# Patient Record
Sex: Female | Born: 2000 | Race: Black or African American | Hispanic: No | Marital: Single | Smoking: Never smoker
Health system: Southern US, Community
[De-identification: ages and names within clinical notes are randomized; demographics above are authoritative.]

## PROBLEM LIST (undated history)

## (undated) DIAGNOSIS — T7840XA Allergy, unspecified, initial encounter: Secondary | ICD-10-CM

## (undated) DIAGNOSIS — F32A Depression, unspecified: Secondary | ICD-10-CM

## (undated) DIAGNOSIS — F419 Anxiety disorder, unspecified: Secondary | ICD-10-CM

## (undated) HISTORY — DX: Allergy, unspecified, initial encounter: T78.40XA

## (undated) HISTORY — DX: Anxiety disorder, unspecified: F41.9

## (undated) HISTORY — PX: NO PAST SURGERIES: SHX2092

## (undated) HISTORY — DX: Depression, unspecified: F32.A

---
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
## 2022-05-14 ENCOUNTER — Ambulatory Visit
Admission: EM | Admit: 2022-05-14 | Discharge: 2022-05-14 | Disposition: A | Discharge status: Home / Self Care | Payer: Medicaid Other | Attending: Emergency Medicine | Admitting: Emergency Medicine

## 2022-05-14 ENCOUNTER — Encounter: Payer: Self-pay | Admitting: Emergency Medicine

## 2022-05-14 DIAGNOSIS — L739 Follicular disorder, unspecified: Secondary | ICD-10-CM
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
## 2022-05-14 MED ORDER — HYDROXYZINE HCL 25 MG PO TABS: 25.0000 mg | ORAL_TABLET | Freq: Four times a day (QID) | ORAL | 0 refills | Status: DC

## 2022-05-14 MED ORDER — PREDNISONE 20 MG PO TABS: 40.0000 mg | ORAL_TABLET | Freq: Every day | ORAL | 0 refills | Status: DC

## 2022-05-14 MED ORDER — CEPHALEXIN 500 MG PO CAPS: 500.0000 mg | ORAL_CAPSULE | Freq: Two times a day (BID) | ORAL | 0 refills | Status: AC

## 2022-05-14 NOTE — ED Provider Notes (Signed)
[facility]    CSN: [id number] Arrival date & time: 05/14/22  4008      History   Chief Complaint Chief Complaint  Patient presents with   Rash    HPI Dawn Mosley is a 21 y.o. female.   Patient presents with generalized scalp pruritus and right-sided scalp pain beginning 3 days ago.  Pruritus is described as severe .  Currently has an braids to the scalp and endorses that she began using a new oral within the last week, has used twice.  Symptoms have not occurred before.  Attempted use of antibiotic ointment which has been ineffective.  Irritation for 3 days, used new oil twice in the last week, itchys, pain to right sides, antibitoic ointment, not helpful,   History reviewed. No pertinent past medical history.  There are no problems to display for this patient.   Past Surgical History:  Procedure Laterality Date   NO PAST SURGERIES      OB History   No obstetric history on file.      Home Medications    Prior to Admission medications   Medication Sig Start Date End Date Taking? Authorizing Provider  naproxen (NAPROSYN) 375 MG tablet Take 1 tablet twice daily as needed for pain. 03/21/21   Molpus, Jonny Ruiz, MD    Family History Family History  Problem Relation Age of Onset   Cancer Mother    Healthy Father     Social History Social History   Tobacco Use   Smoking status: Never   Smokeless tobacco: Never  Vaping Use   Vaping Use: Every day  Substance Use Topics   Alcohol use: Never   Drug use: Never     Allergies   Patient has no known allergies.   Review of Systems Review of Systems  Constitutional: Negative.   Respiratory: Negative.    Cardiovascular: Negative.   Skin:  Positive for rash. Negative for color change, pallor and wound.     Physical Exam Triage Vital Signs ED Triage Vitals  Enc Vitals Group     BP 05/14/22 0955 119/87     Pulse Rate 05/14/22 0955 93     Resp 05/14/22 0955 14     Temp 05/14/22 0955 98.8 F  (37.1 C)     Temp Source 05/14/22 0955 Oral     SpO2 05/14/22 0955 100 %     Weight 05/14/22 0954 108 lb (49 kg)     Height 05/14/22 0954 5\' 4"  (1.626 m)     Head Circumference --      Peak Flow --      Pain Score 05/14/22 0953 8     Pain Loc --      Pain Edu? --      Excl. in GC? --    No data found.  Updated Vital Signs BP 119/87 (BP Location: Left Arm)   Pulse 93   Temp 98.8 F (37.1 C) (Oral)   Resp 14   Ht 5\' 4"  (1.626 m)   Wt 108 lb (49 kg)   LMP 04/21/2022 (Approximate)   SpO2 100%   BMI 18.54 kg/m   Visual Acuity Right Eye Distance:   Left Eye Distance:   Bilateral Distance:    Right Eye Near:   Left Eye Near:    Bilateral Near:     Physical Exam Constitutional:      Appearance: Normal appearance.  HENT:     Head: Normocephalic.  Eyes:     Extraocular  Movements: Extraocular movements intact.  Skin:    Comments: Erythematous raised follicles within the right parietal region, scalp is tender to palpation, no lesions or drainage noted, this is noted throughout the scalp  Neurological:     Mental Status: She is alert and oriented to person, place, and time. Mental status is at baseline.  Psychiatric:        Mood and Affect: Mood normal.        Behavior: Behavior normal.      UC Treatments / Results  Labs (all labs ordered are listed, but only abnormal results are displayed) Labs Reviewed - No data to display  EKG   Radiology No results found.  Procedures Procedures (including critical care time)  Medications Ordered in UC Medications - No data to display  Initial Impression / Assessment and Plan / UC Course  I have reviewed the triage vital signs and the nursing notes.  Pertinent labs & imaging results that were available during my care of the patient were reviewed by me and considered in my medical decision making (see chart for details).  Folliculitis  Presentation is consistent with follicular scalp irritation, discussed with  patient, possibly related to her tension, dandruff or use of new or new paranoia, recommended discontinuation of new product, advised to take out breaks to release tension maintain attention refills, until all symptoms have resolved, provided coverage for bacteria with Keflex and prescribed prednisone and hydroxyzine for management of inflammation and itching, patient may follow-up with urgent care as needed if symptoms persist or worsen Final Clinical Impressions(s) / UC Diagnoses   Final diagnoses:  None   Discharge Instructions   None    ED Prescriptions   None    PDMP not reviewed this encounter.   Valinda Hoar, NP 05/14/22 1026

## 2022-05-14 NOTE — ED Triage Notes (Signed)
Patient reports itchy scalp for the past 2-3 days.  Patient states that her scalp is now sore and painful especially on the right side.

## 2022-05-14 NOTE — Discharge Instructions (Signed)
Today you are being treated for folliculitis which is a inflammatory process of the hair follicle  Begin Keflex every morning and every evening to cover for bacteria that may be aiding to your symptoms  Begin prednisone every morning with food for 5 days, this will help to reduce inflammation and irritation which ideally will reduce your pain  You may take Tylenol 500 to 1000 mg every 6 hours and/or ibuprofen 600 to 800 mg every 6-8 hours for management of your discomfort  You may use hydroxyzine every 6 hours for management of itching, be mindful this medication may make you drowsy  I would recommending removing your braids to release tension from your scalp and to continue to wear a tension free hairstyle until all symptoms have resolved  Please stop use of new pair or you will until all symptoms have resolved as we are unsure at this time if it is a contributing factor  You may purchase over-the-counter Nizoral (ketoconazole) shampoo to help reduce dandruff which may cause additional irritation  May follow-up with urgent care as needed if symptoms persist or worse

## 2022-05-20 ENCOUNTER — Telehealth: Payer: Self-pay

## 2022-05-20 NOTE — Telephone Encounter (Signed)
Patient called to connect with a Primary Care Provider. Patient has Managed Medicaid. LVM for patient to call [phone] to discuss making an appoint with a Primary Care Provider. If patient returns call, please reach out to Sarah or Henritta Mutz, RN.   

## 2022-06-01 ENCOUNTER — Telehealth: Payer: Self-pay

## 2022-06-01 NOTE — Telephone Encounter (Signed)
Patient called to connect with a Primary Care Provider. Patient has Managed Medicaid. LVM for patient to call [phone] to discuss making an appoint with a Primary Care Provider. If patient returns call, please reach out to Sarah or Sonji Starkes, RN.   

## 2022-06-02 ENCOUNTER — Telehealth: Payer: Self-pay

## 2022-06-02 NOTE — Telephone Encounter (Signed)
Patient called to connect with a Primary Care Provider. Patient has Managed Medicaid. LVM for patient to call [phone] to discuss making an appoint with a Primary Care Provider. If patient returns call, please reach out to Sakari Alkhatib or Leslie, RN.   

## 2022-06-03 ENCOUNTER — Telehealth: Payer: Self-pay

## 2022-06-03 NOTE — Telephone Encounter (Signed)
Pt. Has Managed Medicaid. Appointment made with new PCP at [facility].

## 2022-09-24 ENCOUNTER — Ambulatory Visit (INDEPENDENT_AMBULATORY_CARE_PROVIDER_SITE_OTHER): Payer: BLUE CROSS/BLUE SHIELD | Admitting: Physician Assistant

## 2022-09-24 ENCOUNTER — Encounter: Payer: Self-pay | Admitting: Physician Assistant

## 2022-09-24 VITALS — BP 107/76 | HR 96

## 2022-09-24 DIAGNOSIS — Z833 Family history of diabetes mellitus: Secondary | ICD-10-CM

## 2022-09-24 DIAGNOSIS — R42 Dizziness and giddiness: Secondary | ICD-10-CM

## 2022-09-24 DIAGNOSIS — Z23 Encounter for immunization: Secondary | ICD-10-CM

## 2022-09-24 DIAGNOSIS — Z114 Encounter for screening for human immunodeficiency virus [HIV]: Secondary | ICD-10-CM | POA: Diagnosis not present

## 2022-09-24 DIAGNOSIS — Z1159 Encounter for screening for other viral diseases: Secondary | ICD-10-CM

## 2022-09-24 NOTE — Progress Notes (Signed)
I,Sha'taria Tyson,acting as a Education administrator for [employer], PA-C.,have documented all relevant documentation on the behalf of Mikey Kirschner, PA-C,as directed by  Mikey Kirschner, PA-C while in the presence of Mikey Kirschner, PA-C.  New patient visit   Patient: Dawn Mosley   DOB: [birth year]   22 y.o. Female  MRN: [id number] Visit Date: 09/24/2022  Today's healthcare provider: Mikey Kirschner, PA-C   Cc. Dizziness, establish care  Subjective    Dawn Mosley is a 22 y.o. female who presents today as a new patient to establish care.   Anemia Patient presents for evaluation of anemia. Believes it has been present for several  years . Associated signs & symptoms: dizziness/lightheadedness, family history of anemia (sister). Denies palpitations, syncope.  Dizziness when standing up too fast, sometimes sees starts. LMP 09/24/22, reports heavy, changes tampons 3-4 hours for the first 1-2 days.       09/24/2022    9:26 AM  GAD 7 : Generalized Anxiety Score  Nervous, Anxious, on Edge 1  Control/stop worrying 1  Worry too much - different things 2  Trouble relaxing 1  Restless 3  Easily annoyed or irritable 2  Afraid - awful might happen 0  Total GAD 7 Score 10  Anxiety Difficulty Very difficult         09/24/2022    9:26 AM  PHQ9 SCORE ONLY  PHQ-9 Total Score 6     Past Medical History:  Diagnosis Date   Allergy    Anxiety    Depression    Past Surgical History:  Procedure Laterality Date   NO PAST SURGERIES     Family Status  Relation Name Status   Mother  Alive   Father  Alive   Sister  Alive   Brother  Alive   MGM  Deceased   Family History  Problem Relation Age of Onset   Cancer Mother        removed 2-3 years ago (lump behind ear)   Diabetes Father 61 - 56       2-3 years ago   Anemia Sister    Lung cancer Maternal Grandmother 76 - 76   Social History   Socioeconomic History   Marital status: Single    Spouse name: Not on file   Number of  children: Not on file   Years of education: Not on file   Highest education level: Not on file  Occupational History   Not on file  Tobacco Use   Smoking status: Never   Smokeless tobacco: Never  Vaping Use   Vaping Use: Every day   Start date: 08/30/2017  Substance and Sexual Activity   Alcohol use: Never   Drug use: Never   Sexual activity: Not Currently    Birth control/protection: None  Other Topics Concern   Not on file  Social History Narrative   Not on file   Social Determinants of Health   Financial Resource Strain: Not on file  Food Insecurity: Not on file  Transportation Needs: Not on file  Physical Activity: Not on file  Stress: Not on file  Social Connections: Not on file   Outpatient Medications Prior to Visit  Medication Sig   [DISCONTINUED] hydrOXYzine (ATARAX) 25 MG tablet Take 1 tablet (25 mg total) by mouth every 6 (six) hours. (Patient not taking: Reported on 09/24/2022)   [DISCONTINUED] naproxen (NAPROSYN) 375 MG tablet Take 1 tablet twice daily as needed for pain. (Patient not taking: Reported on 09/24/2022)   [  DISCONTINUED] predniSONE (DELTASONE) 20 MG tablet Take 2 tablets (40 mg total) by mouth daily.   No facility-administered medications prior to visit.   No Known Allergies  Immunization History  Administered Date(s) Administered   COVID-19, mRNA, vaccine(Comirnaty)12 years and older 09/24/2022   DTaP 04/25/2003, 07/03/2003, 09/05/2003, 05/28/2004, 05/03/2007   HIB (PRP-OMP) 04/25/2003, 07/03/2003, 09/05/2003, 05/28/2004   HPV 9-valent 06/02/2017, 06/04/2020   Hepatitis B, PED/ADOLESCENT May 02, 2003, 03/26/2003, 01/28/2004   Influenza,inj,Quad PF,6+ Mos 09/24/2022   Influenza-Unspecified 09/05/2003, 08/20/2004, 06/26/2008, 05/29/2009, 05/29/2010, 05/20/2014, 06/02/2017   MMR 02/27/2004, 05/03/2007   MenQuadfi_Meningococcal Groups ACYW Conjugate 06/04/2020   Meningococcal B, Unspecified 05/20/2014   Meningococcal Mcv4o 06/04/2020    PFIZER(Purple Top)SARS-COV-2 Vaccination 04/19/2020   Pneumococcal Conjugate,unspecified 09/05/2003, 05/28/2004   Pneumococcal-Unspecified 04/25/2003, 07/03/2003   Polio, Unspecified 05/03/2003, 07/03/2003, 01/28/2004, 05/03/2007   Tdap 05/20/2014   Varicella 02/27/2004, 05/03/2007    Health Maintenance  Topic Date Due   Hepatitis C Screening  Never done   DTaP/Tdap/Td (7 - Td or Tdap) 05/20/2024   INFLUENZA VACCINE  Completed   HPV VACCINES  Completed   COVID-19 Vaccine  Completed   HIV Screening  Completed    Patient Care Team: Mikey Kirschner, PA-C as PCP - General (Physician Assistant)  Review of Systems  Constitutional:  Positive for fatigue.  HENT:  Positive for dental problem.   Allergic/Immunologic: Positive for environmental allergies.  Neurological:  Positive for dizziness, weakness and light-headedness.     Objective    Blood pressure 107/76, pulse 96, last menstrual period 09/24/2022, SpO2 100 %.   Physical Exam Constitutional:      General: She is awake.     Appearance: She is well-developed.  HENT:     Head: Normocephalic.  Eyes:     Conjunctiva/sclera: Conjunctivae normal.  Cardiovascular:     Rate and Rhythm: Normal rate and regular rhythm.     Heart sounds: Normal heart sounds.  Pulmonary:     Effort: Pulmonary effort is normal.     Breath sounds: Normal breath sounds.  Skin:    General: Skin is warm.  Neurological:     Mental Status: She is alert and oriented to person, place, and time.  Psychiatric:        Attention and Perception: Attention normal.        Mood and Affect: Mood normal.        Speech: Speech normal.        Behavior: Behavior is cooperative.    Depression Screen    09/24/2022    9:26 AM  PHQ 2/9 Scores  PHQ - 2 Score 2  PHQ- 9 Score 6   No results found for any visits on 09/24/22.  Assessment & Plan     Dizziness Vitals WNR Will evaluate for anemia -- cbc, cmp, folate, b12, iron .  2. Fhx DM Will check  A1c  Problem List Items Addressed This Visit   None Visit Diagnoses     Dizziness    -  Primary   Relevant Orders   CBC w/Diff/Platelet   Comprehensive Metabolic Panel (CMET)   Iron, TIBC and Ferritin Panel   B12 and Folate Panel   Encounter for screening for HIV       Relevant Orders   HIV antibody (with reflex)   Encounter for hepatitis C screening test for low risk patient       Relevant Orders   Hepatitis C antibody   Family history of diabetes mellitus  Relevant Orders   HgB A1c   Need for immunization against influenza       Relevant Orders   Flu Vaccine QUAD 65mo+IM (Fluarix, Fluzone & Alfiuria Quad PF) (Completed)   Encounter for immunization       Relevant Orders   Pfizer Fall 2023 Covid-19 Vaccine 22yrs and older (Completed)       Return if symptoms worsen or fail to improve; pending labs.     I, Alfredia Ferguson, PA-C have reviewed all documentation for this visit. The documentation on  09/24/22 for the exam, diagnosis, procedures, and orders are all accurate and complete.  Alfredia Ferguson, PA-C [facility] [address] [place], [place], [address] Office: [phone] Fax: [phone]    [facility]

## 2022-09-25 LAB — COMPREHENSIVE METABOLIC PANEL
ALT: 6 IU/L (ref 0–32)
AST: 12 IU/L (ref 0–40)
Albumin/Globulin Ratio: 1.4 (ref 1.2–2.2)
Albumin: 4.5 g/dL (ref 4.0–5.0)
Alkaline Phosphatase: 72 IU/L (ref 42–106)
BUN/Creatinine Ratio: 11 (ref 9–23)
BUN: 9 mg/dL (ref 6–20)
Bilirubin Total: 0.5 mg/dL (ref 0.0–1.2)
CO2: 23 mmol/L (ref 20–29)
Calcium: 10 mg/dL (ref 8.7–10.2)
Chloride: 101 mmol/L (ref 96–106)
Creatinine, Ser: 0.8 mg/dL (ref 0.57–1.00)
Globulin, Total: 3.3 g/dL (ref 1.5–4.5)
Glucose: 83 mg/dL (ref 70–99)
Potassium: 4.1 mmol/L (ref 3.5–5.2)
Sodium: 138 mmol/L (ref 134–144)
Total Protein: 7.8 g/dL (ref 6.0–8.5)
eGFR: 109 mL/min/{1.73_m2} (ref >59)

## 2022-09-25 LAB — CBC WITH DIFFERENTIAL/PLATELET
Basophils Absolute: 0 10*3/uL (ref 0.0–0.2)
Basos: 1 %
EOS (ABSOLUTE): 0.3 10*3/uL (ref 0.0–0.4)
Eos: 5 %
Hematocrit: 40.3 % (ref 34.0–46.6)
Hemoglobin: 13.7 g/dL (ref 11.1–15.9)
Immature Grans (Abs): 0 10*3/uL (ref 0.0–0.1)
Immature Granulocytes: 0 %
Lymphocytes Absolute: 2.2 10*3/uL (ref 0.7–3.1)
Lymphs: 40 %
MCH: 30.9 pg (ref 26.6–33.0)
MCHC: 34 g/dL (ref 31.5–35.7)
MCV: 91 fL (ref 79–97)
Monocytes Absolute: 0.3 10*3/uL (ref 0.1–0.9)
Monocytes: 6 %
Neutrophils Absolute: 2.7 10*3/uL (ref 1.4–7.0)
Neutrophils: 48 %
Platelets: 282 10*3/uL (ref 150–450)
RBC: 4.44 x10E6/uL (ref 3.77–5.28)
RDW: 12 % (ref 11.7–15.4)
WBC: 5.6 10*3/uL (ref 3.4–10.8)

## 2022-09-25 LAB — HEMOGLOBIN A1C
Est. average glucose Bld gHb Est-mCnc: 100 mg/dL
Hgb A1c MFr Bld: 5.1 % (ref 4.8–5.6)

## 2022-09-25 LAB — IRON,TIBC AND FERRITIN PANEL
Ferritin: 45 ng/mL (ref 15–77)
Iron Saturation: 27 % (ref 15–55)
Iron: 96 ug/dL (ref 27–159)
Total Iron Binding Capacity: 359 ug/dL (ref 250–450)
UIBC: 263 ug/dL (ref 131–425)

## 2022-09-25 LAB — HEPATITIS C ANTIBODY: Hep C Virus Ab: NONREACTIVE

## 2022-09-25 LAB — B12 AND FOLATE PANEL
Folate: 8.4 ng/mL (ref >3.0)
Vitamin B-12: 840 pg/mL (ref 232–1245)

## 2022-09-25 LAB — HIV ANTIBODY (ROUTINE TESTING W REFLEX): HIV Screen 4th Generation wRfx: NONREACTIVE

## 2022-09-27 ENCOUNTER — Other Ambulatory Visit: Payer: Self-pay | Admitting: Physician Assistant

## 2022-09-27 DIAGNOSIS — R42 Dizziness and giddiness: Secondary | ICD-10-CM

## 2022-10-07 LAB — TSH+FREE T4
Free T4: 1.28 ng/dL (ref 0.93–1.60)
TSH: 0.767 u[IU]/mL (ref 0.450–4.500)

## 2023-01-31 ENCOUNTER — Telehealth: Payer: Self-pay

## 2023-01-31 NOTE — Telephone Encounter (Signed)
LVM. Also sending mychart msg. AS, CMA 

## 2023-02-03 ENCOUNTER — Other Ambulatory Visit (HOSPITAL_COMMUNITY)
Admission: RE | Admit: 2023-02-03 | Discharge: 2023-02-03 | Disposition: A | Discharge status: Home / Self Care | Payer: Medicaid Other | Source: Ambulatory Visit | Attending: Physician Assistant | Admitting: Physician Assistant

## 2023-02-03 ENCOUNTER — Other Ambulatory Visit: Payer: Self-pay | Admitting: Physician Assistant

## 2023-02-03 ENCOUNTER — Ambulatory Visit (INDEPENDENT_AMBULATORY_CARE_PROVIDER_SITE_OTHER): Payer: BLUE CROSS/BLUE SHIELD | Admitting: Physician Assistant

## 2023-02-03 ENCOUNTER — Encounter: Payer: Self-pay | Admitting: Physician Assistant

## 2023-02-03 VITALS — BP 94/71 | HR 105 | Temp 99.2°F | Resp 15 | Ht 63.0 in | Wt 102.6 lb

## 2023-02-03 DIAGNOSIS — N898 Other specified noninflammatory disorders of vagina: Secondary | ICD-10-CM | POA: Insufficient documentation

## 2023-02-03 DIAGNOSIS — R300 Dysuria: Secondary | ICD-10-CM | POA: Diagnosis not present

## 2023-02-03 LAB — POCT URINALYSIS DIPSTICK
Bilirubin, UA: NEGATIVE
Blood, UA: NEGATIVE
Glucose, UA: NEGATIVE
Ketones, UA: NEGATIVE
Nitrite, UA: NEGATIVE
Protein, UA: NEGATIVE
Spec Grav, UA: 1.02 (ref 1.010–1.025)
Urobilinogen, UA: 0.2 E.U./dL
pH, UA: 6 (ref 5.0–8.0)

## 2023-02-03 NOTE — Progress Notes (Signed)
Established patient visit  Patient: Dawn Mosley   DOB: 2001-08-18   22 y.o. Female  MRN: [id number] Visit Date: 02/03/2023  Today's healthcare provider: Debera Lat, PA-C   Chief Complaint  Patient presents with   Acute Visit   Subjective    HPI  The patient, with no significant past medical history, presents with a one-month history of dysuria and vaginal discharge. The dysuria was sudden in onset and is associated with a change in the smell of the urine. The vaginal discharge is described as opaque, whitish in color, and thick in consistency, but not associated with any noticeable odor or itching. The patient denies any recent viral infections or stomach bugs.  The patient is sexually active and had her first sexual encounter last year. She had a six-month hiatus and resumed sexual activity recently. The onset of the symptoms was noted approximately five days after her last sexual encounter. The patient uses condoms for contraception and is not currently on any form of hormonal birth control.     02/03/2023    1:28 PM 09/24/2022    9:26 AM  Depression screen PHQ 2/9  Decreased Interest 1 1  Down, Depressed, Hopeless 0 1  PHQ - 2 Score 1 2  Altered sleeping 0 2  Tired, decreased energy 2 2  Change in appetite 1 0  Feeling bad or failure about yourself  0 0  Trouble concentrating 0 0  Moving slowly or fidgety/restless 0 0  Suicidal thoughts 0 0  PHQ-9 Score 4 6  Difficult doing work/chores Not difficult at all Very difficult      09/24/2022    9:26 AM  GAD 7 : Generalized Anxiety Score  Nervous, Anxious, on Edge 1  Control/stop worrying 1  Worry too much - different things 2  Trouble relaxing 1  Restless 3  Easily annoyed or irritable 2  Afraid - awful might happen 0  Total GAD 7 Score 10  Anxiety Difficulty Very difficult    Medications: No outpatient medications prior to visit.   No facility-administered medications prior to visit.    Review of Systems   Constitutional:  Negative for fatigue and fever.  Genitourinary:  Positive for vaginal discharge. Negative for flank pain, frequency, hematuria, urgency and vaginal pain.  All other systems reviewed and are negative.  Except see HPI      Objective    BP 94/71 (BP Location: Right Arm, Patient Position: Sitting, Cuff Size: Normal)   Pulse (!) 105   Temp 99.2 F (37.3 C) (Oral)   Resp 15   Ht 5\' 3"  (1.6 m)   Wt 102 lb 9.6 oz (46.5 kg)   LMP 01/23/2023 (Approximate)   SpO2 99%   BMI 18.17 kg/m    Physical Exam Constitutional:      Appearance: Normal appearance. She is normal weight.  HENT:     Head: Normocephalic and atraumatic.  Eyes:     Extraocular Movements: Extraocular movements intact.     Conjunctiva/sclera: Conjunctivae normal.     Pupils: Pupils are equal, round, and reactive to light.  Pulmonary:     Effort: Pulmonary effort is normal.  Abdominal:     General: Abdomen is flat. Bowel sounds are normal. There is no distension.     Palpations: Abdomen is soft. There is no mass.     Tenderness: There is no abdominal tenderness. There is no right CVA tenderness, left CVA tenderness, guarding or rebound.     Hernia: No hernia is present.  Genitourinary:    Comments: Enlargement of inguinal lymph nodes Neurological:     Mental Status: She is alert.  Psychiatric:        Behavior: Behavior normal.        Thought Content: Thought content normal.        Judgment: Judgment normal.     Deferred pelvic / speculum exam No results found for any visits on 02/03/23.  Assessment & Plan    1. Dysuria X 1-2 mo POCT UA dipstick was negative except trace of leuko - Hepatitis, Acute - RPR - HIV antibody (with reflex) - Cervicovaginal ancillary only - Urine Culture pending Will reassess  2. Vaginal discharge X 1-2 mo Dysuria and Vaginal Discharge: Onset approximately one month ago, with recent sexual activity. New partner x 2 mo. No significant findings on physical  examination except for slightly enlarged lymph nodes. No fever, abdominal pain, or changes in urine or stool color. -Obtain vaginal swab for culture and STD testing. -Order blood work for STD screening. -Advise patient to report any changes in symptoms, such as fever or changes in vaginal discharge. - Hepatitis, Acute - RPR - HIV antibody (with reflex) - Cervicovaginal ancillary only - Urine Culture Will reassess after  receiving lab results  Sexual Health: Patient is sexually active and using condoms for contraception and STD prevention. No current desire for additional birth control methods. -Encourage continued use of condoms for STD prevention. -Advise patient to inform clinic if she decides to start on contraception in the future.  Follow-up: Pending lab results. -Allow for messages to be left on patient's mobile phone regarding lab results. -Advise patient to return to clinic if symptoms change or worsen.   No follow-ups on file.     The patient was advised to call back or seek an in-person evaluation if the symptoms worsen or if the condition fails to improve as anticipated.  I discussed the assessment and treatment plan with the patient. The patient was provided an opportunity to ask questions and all were answered. The patient agreed with the plan and demonstrated an understanding of the instructions.  I, Debera Lat, PA-C have reviewed all documentation for this visit. The documentation on  02/03/23  for the exam, diagnosis, procedures, and orders are all accurate and complete.  Debera Lat, [facility], MMS [facility] [phone] (phone) [phone] (fax)  [facility]

## 2023-02-07 LAB — CERVICOVAGINAL ANCILLARY ONLY
Bacterial Vaginitis (gardnerella): POSITIVE — AB
Candida Glabrata: NEGATIVE
Candida Vaginitis: POSITIVE — AB
Chlamydia: NEGATIVE
Comment: NEGATIVE
Comment: NEGATIVE
Comment: NEGATIVE
Comment: NEGATIVE
Comment: NEGATIVE
Comment: NORMAL
Neisseria Gonorrhea: NEGATIVE
Trichomonas: NEGATIVE

## 2023-02-08 LAB — URINE CULTURE

## 2023-02-08 LAB — SPECIMEN STATUS REPORT

## 2023-02-09 ENCOUNTER — Other Ambulatory Visit: Payer: Self-pay | Admitting: Physician Assistant

## 2023-02-09 DIAGNOSIS — N3000 Acute cystitis without hematuria: Secondary | ICD-10-CM

## 2023-02-09 DIAGNOSIS — B9689 Other specified bacterial agents as the cause of diseases classified elsewhere: Secondary | ICD-10-CM

## 2023-02-09 DIAGNOSIS — B3731 Acute candidiasis of vulva and vagina: Secondary | ICD-10-CM

## 2023-02-09 LAB — ACUTE VIRAL HEPATITIS (HAV, HBV, HCV)
HCV Ab: NONREACTIVE
Hep A IgM: NEGATIVE
Hep B C IgM: NEGATIVE
Hepatitis B Surface Ag: NEGATIVE

## 2023-02-09 LAB — RPR: RPR Ser Ql: NONREACTIVE

## 2023-02-09 LAB — HIV ANTIBODY (ROUTINE TESTING W REFLEX): HIV Screen 4th Generation wRfx: NONREACTIVE

## 2023-02-09 LAB — HCV INTERPRETATION

## 2023-02-09 MED ORDER — NITROFURANTOIN MONOHYD MACRO 100 MG PO CAPS: 100.0000 mg | ORAL_CAPSULE | Freq: Two times a day (BID) | ORAL | 0 refills | Status: DC

## 2023-02-09 MED ORDER — CLOTRIMAZOLE 1 % VA CREA: 1.0000 | TOPICAL_CREAM | Freq: Every day | VAGINAL | 0 refills | Status: DC

## 2023-02-09 MED ORDER — METRONIDAZOLE 500 MG PO TABS: 500.0000 mg | ORAL_TABLET | Freq: Two times a day (BID) | ORAL | 0 refills | Status: AC

## 2023-02-09 NOTE — Progress Notes (Signed)
Called to pt and explained that she needs to start treatment for UTI, yeast infection and bacterial vaginosis and schedule a FU appt after completion of her treatment

## 2023-02-17 ENCOUNTER — Encounter: Payer: Self-pay | Admitting: Physician Assistant

## 2023-02-17 ENCOUNTER — Ambulatory Visit (INDEPENDENT_AMBULATORY_CARE_PROVIDER_SITE_OTHER): Payer: BLUE CROSS/BLUE SHIELD | Admitting: Physician Assistant

## 2023-02-17 VITALS — BP 106/82 | HR 89 | Temp 98.8°F | Resp 14 | Ht 63.0 in | Wt 102.9 lb

## 2023-02-17 DIAGNOSIS — B3731 Acute candidiasis of vulva and vagina: Secondary | ICD-10-CM

## 2023-02-17 DIAGNOSIS — Z3009 Encounter for other general counseling and advice on contraception: Secondary | ICD-10-CM

## 2023-02-17 DIAGNOSIS — N760 Acute vaginitis: Secondary | ICD-10-CM | POA: Diagnosis not present

## 2023-02-17 DIAGNOSIS — B9689 Other specified bacterial agents as the cause of diseases classified elsewhere: Secondary | ICD-10-CM

## 2023-02-17 NOTE — Progress Notes (Signed)
I,Vanessa  Vital,acting as a Neurosurgeon for [employer], PA-C.,have documented all relevant documentation on the behalf of Alfredia Ferguson, PA-C,as directed by  Alfredia Ferguson, PA-C while in the presence of Alfredia Ferguson, PA-C.   Established patient visit   Patient: Dawn Mosley   DOB: 2001-05-01   22 y.o. Female  MRN: [id number] Visit Date: 02/17/2023  Today's healthcare provider: Alfredia Ferguson, PA-C   Cc. F/u vaginal infection  Subjective    HPI   Discussed the use of AI scribe software for clinical note transcription with the patient, who gave verbal consent to proceed.  History of Present Illness   She reports an improvement in symptoms, with no pain during urination, no excessive discharge.  In addition to the infection follow-up, the patient expresses an interest in starting birth control. She has concerns about potential side effects, particularly weight gain, and is seeking a method that would be easy to adhere to. She expresses a preference for a method that would not require daily administration and would be effective for a few years. The patient also mentions a desire to avoid methods that could potentially worsen menstrual cramps, as she already experiences heavy periods.       Pt reports the Flagyl made her nauseous and she didn't finish taking it.  Medications: Outpatient Medications Prior to Visit  Medication Sig   [DISCONTINUED] clotrimazole (GYNE-LOTRIMIN) 1 % vaginal cream Place 1 Applicatorful vaginally at bedtime. For 7 nights (Patient not taking: Reported on 02/17/2023)   [DISCONTINUED] nitrofurantoin, macrocrystal-monohydrate, (MACROBID) 100 MG capsule Take 1 capsule (100 mg total) by mouth 2 (two) times daily. (Patient not taking: Reported on 02/17/2023)   No facility-administered medications prior to visit.    Review of Systems  All other systems reviewed and are negative.    Objective    BP 106/82 (BP Location: Left Arm, Patient Position:  Sitting, Cuff Size: Normal)   Pulse 89   Temp 98.8 F (37.1 C) (Oral)   Resp 14   Ht 5\' 3"  (1.6 m)   Wt 102 lb 14.4 oz (46.7 kg)   LMP 01/23/2023 (Approximate)   SpO2 100%   BMI 18.23 kg/m  Blood pressure 106/82, pulse 89, temperature 98.8 F (37.1 C), temperature source Oral, resp. rate 14, height 5\' 3"  (1.6 m), weight 102 lb 14.4 oz (46.7 kg), last menstrual period 01/23/2023, SpO2 100 %.  Physical Exam Vitals reviewed.  Constitutional:      Appearance: She is not ill-appearing.  HENT:     Head: Normocephalic.  Eyes:     Conjunctiva/sclera: Conjunctivae normal.  Cardiovascular:     Rate and Rhythm: Normal rate.  Pulmonary:     Effort: Pulmonary effort is normal. No respiratory distress.  Musculoskeletal:        General: Injury: .h[isec.  Neurological:     General: No focal deficit present.     Mental Status: She is alert and oriented to person, place, and time.  Psychiatric:        Mood and Affect: Mood normal.        Behavior: Behavior normal.      No results found for any visits on 02/17/23.  Assessment & Plan     1. BV (bacterial vaginosis) 2. Vaginal yeast infection Repeat swab to confirm resolution  - NuSwab Vaginitis Plus (VG+)  3. Encounter for counseling regarding contraception -Advised patient on various birth control options, including IUDs, Nexplanon, Depo-Provera shot, and oral contraceptives. -Emphasized the importance of consistent use for  effectiveness and the need for condoms for STD protection.  -Refer to GYN as pt is interested in IUDs. She is also interested in Nexplanon advised she call back to schedule w/ Dr Roxan Hockey if interested-- would need to order ahead of time  - Ambulatory referral to Gynecology   Return if symptoms worsen or fail to improve.      I, Alfredia Ferguson, PA-C have reviewed all documentation for this visit. The documentation on  02/17/23   for the exam, diagnosis, procedures, and orders are all accurate and  complete. Alfredia Ferguson, PA-C [facility] [address] [place], [place], [address] Office: [phone] Fax: [phone]   [facility]

## 2023-02-19 LAB — NUSWAB VAGINITIS PLUS (VG+)

## 2023-02-20 LAB — NUSWAB VAGINITIS PLUS (VG+)
Candida glabrata, NAA: NEGATIVE
Chlamydia trachomatis, NAA: NEGATIVE
Neisseria gonorrhoeae, NAA: NEGATIVE
Trich vag by NAA: NEGATIVE

## 2023-02-24 ENCOUNTER — Other Ambulatory Visit: Payer: Self-pay | Admitting: Physician Assistant

## 2023-02-24 DIAGNOSIS — B3731 Acute candidiasis of vulva and vagina: Secondary | ICD-10-CM

## 2023-02-24 MED ORDER — FLUCONAZOLE 150 MG PO TABS: 150.0000 mg | ORAL_TABLET | Freq: Once | ORAL | 1 refills | Status: AC

## 2023-03-21 ENCOUNTER — Ambulatory Visit: Payer: Medicaid Other | Admitting: Obstetrics & Gynecology

## 2023-03-21 ENCOUNTER — Encounter: Payer: Self-pay | Admitting: Obstetrics & Gynecology

## 2023-03-21 ENCOUNTER — Other Ambulatory Visit (HOSPITAL_COMMUNITY)
Admission: RE | Admit: 2023-03-21 | Discharge: 2023-03-21 | Disposition: A | Discharge status: Home / Self Care | Payer: Medicaid Other | Source: Ambulatory Visit | Attending: Family Medicine | Admitting: Family Medicine

## 2023-03-21 VITALS — BP 115/82 | HR 101 | Ht 63.0 in | Wt 102.0 lb

## 2023-03-21 DIAGNOSIS — Z3009 Encounter for other general counseling and advice on contraception: Secondary | ICD-10-CM | POA: Diagnosis not present

## 2023-03-21 DIAGNOSIS — N760 Acute vaginitis: Secondary | ICD-10-CM | POA: Insufficient documentation

## 2023-03-21 DIAGNOSIS — Z30017 Encounter for initial prescription of implantable subdermal contraceptive: Secondary | ICD-10-CM | POA: Diagnosis not present

## 2023-03-21 DIAGNOSIS — B3731 Acute candidiasis of vulva and vagina: Secondary | ICD-10-CM | POA: Diagnosis not present

## 2023-03-21 DIAGNOSIS — Z3202 Encounter for pregnancy test, result negative: Secondary | ICD-10-CM

## 2023-03-21 DIAGNOSIS — Z975 Presence of (intrauterine) contraceptive device: Secondary | ICD-10-CM | POA: Insufficient documentation

## 2023-03-21 LAB — POCT URINE PREGNANCY: Preg Test, Ur: NEGATIVE

## 2023-03-21 MED ORDER — ETONOGESTREL 68 MG ~~LOC~~ IMPL
68.0000 mg | DRUG_IMPLANT | Freq: Once | SUBCUTANEOUS | Status: AC
  Administered 2023-03-21: 68 mg via SUBCUTANEOUS

## 2023-03-21 NOTE — Progress Notes (Addendum)
GYNECOLOGY OFFICE VISIT NOTE  History:   Dawn Mosley is a 22 y.o. G0 here today for contraception counseling. Was treated for vaginitis last month, wants another testing to make sure "all is gone".  She denies any abnormal vaginal discharge, bleeding, pelvic pain or other concerns.    Past Medical History:  Diagnosis Date   Allergy    Anxiety    Depression     Past Surgical History:  Procedure Laterality Date   NO PAST SURGERIES      The following portions of the patient's history were reviewed and updated as appropriate: allergies, current medications, past family history, past medical history, past social history, past surgical history and problem list.   Health Maintenance:  She has received HPV vaccine series.   Review of Systems:  Pertinent items noted in HPI and remainder of comprehensive ROS otherwise negative.  Physical Exam:  BP 115/82   Pulse (!) 101   Ht 5\' 3"  (1.6 m)   Wt 102 lb (46.3 kg)   LMP 03/12/2023   BMI 18.07 kg/m  CONSTITUTIONAL: Well-developed, well-nourished female in no acute distress.  HEENT:  Normocephalic, atraumatic. External right and left ear normal. No scleral icterus.  NECK: Normal range of motion, supple, no masses noted on observation SKIN: No rash noted. Not diaphoretic. No erythema. No pallor. MUSCULOSKELETAL: Normal range of motion. No edema noted. NEUROLOGIC: Alert and oriented to person, place, and time. Normal muscle tone coordination. No cranial nerve deficit noted. PSYCHIATRIC: Normal mood and affect. Normal behavior. Normal judgment and thought content. CARDIOVASCULAR: Normal heart rate noted RESPIRATORY: Effort and breath sounds normal, no problems with respiration noted ABDOMEN: No masses noted. No other overt distention noted.   PELVIC: Deferred. Self-swab sample obtained.  Contraception Counseling Reviewed all forms of reversible birth control options available including abstinence; fertility period awareness methods;  over the counter/barrier methods; hormonal contraceptive medication including pill, patch, ring, injection,contraceptive implant; hormonal and nonhormonal IUDs. Risks and benefits reviewed.  Questions were answered.  Information was given to patient to review. She desires Nexplanon today, last had intercourse about 2 months ago.    Results for orders placed or performed in visit on 03/21/23 (from the past 24 hour(s))  POCT urine pregnancy     Status: Normal   Collection Time: 03/21/23  3:05 PM  Result Value Ref Range   Preg Test, Ur Negative Negative   Nexplanon Insertion Procedure Patient identified, informed consent performed, consent signed.   Patient does understand that irregular bleeding is a very common side effect of this medication. She was advised to have backup contraception for one week after placement. Pregnancy test in clinic today was negative.  Appropriate time out taken.  Patient's right arm (she is left-handed) was prepped and draped in the usual sterile fashion. The ruler used to measure and mark insertion area.  Patient was prepped with alcohol swab and then injected with 3 ml of 1% lidocaine.  She was prepped with betadine, Nexplanon removed from packaging,  Device confirmed in needle, then inserted full length of needle and withdrawn per handbook instructions. Nexplanon was able to palpated in the patient's right arm; patient palpated the insert herself. There was minimal blood loss.  Patient insertion site covered with guaze and a pressure bandage to reduce any bruising.  The patient tolerated the procedure well and was given post procedure instructions.      Assessment and Plan:     1. Vaginitis and vulvovaginitis - Cervicovaginal ancillary only done, will  follow up results and manage accordingly.  2. Nexplanon insertion Nexplanon placed in her right arm today, can be in place up to 4 years - etonogestrel (NEXPLANON) implant 68 mg She was advised to call for any concerning  symptoms.   Routine preventative health maintenance measures emphasized. Please refer to After Visit Summary for other counseling recommendations.   Return for any gynecologic concerns.    I spent 30 minutes dedicated to the care of this patient including pre-visit review of records, face to face time with the patient discussing her conditions and treatments and post visit orders.    Jaynie Collins, MD, FACOG Obstetrician & Gynecologist, [facility] for [employer], [facility]

## 2023-03-21 NOTE — Patient Instructions (Signed)
Nexplanon Instructions After Insertion  Keep bandage clean and dry for 24 hours  May use ice/Tylenol/Ibuprofen for soreness or pain  If you develop fever, drainage or increased warmth from incision site-contact office immediately   

## 2023-03-23 LAB — CERVICOVAGINAL ANCILLARY ONLY
Bacterial Vaginitis (gardnerella): NEGATIVE
Candida Glabrata: NEGATIVE
Candida Vaginitis: POSITIVE — AB
Chlamydia: NEGATIVE
Comment: NEGATIVE
Comment: NEGATIVE
Comment: NEGATIVE
Comment: NEGATIVE
Comment: NEGATIVE
Comment: NORMAL
Neisseria Gonorrhea: NEGATIVE
Trichomonas: NEGATIVE

## 2023-03-24 MED ORDER — FLUCONAZOLE 150 MG PO TABS: 150.0000 mg | ORAL_TABLET | Freq: Once | ORAL | 3 refills | Status: AC

## 2023-03-24 NOTE — Addendum Note (Signed)
Addended by: Jaynie Collins A on: 03/24/2023 07:47 AM   Modules accepted: Orders

## 2023-04-10 ENCOUNTER — Encounter: Payer: Self-pay | Admitting: Physician Assistant

## 2023-05-13 ENCOUNTER — Encounter: Payer: Medicaid Other | Admitting: Family Medicine

## 2023-06-15 ENCOUNTER — Encounter: Payer: Self-pay | Admitting: Physician Assistant

## 2023-06-15 ENCOUNTER — Ambulatory Visit: Payer: Medicaid Other | Admitting: Physician Assistant

## 2023-06-15 VITALS — BP 111/82 | HR 82 | Temp 98.6°F | Ht 63.0 in | Wt 107.5 lb

## 2023-06-15 DIAGNOSIS — N926 Irregular menstruation, unspecified: Secondary | ICD-10-CM | POA: Diagnosis not present

## 2023-06-15 DIAGNOSIS — Z23 Encounter for immunization: Secondary | ICD-10-CM | POA: Diagnosis not present

## 2023-06-15 DIAGNOSIS — M79602 Pain in left arm: Secondary | ICD-10-CM | POA: Diagnosis not present

## 2023-06-15 DIAGNOSIS — M79601 Pain in right arm: Secondary | ICD-10-CM

## 2023-06-15 NOTE — Progress Notes (Signed)
Established patient visit  Patient: Dawn Mosley   DOB: Aug 25, 2001   22 y.o. 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 for the last 2 months, no prior treatment    Subjective     Discussed the use of AI scribe software for clinical note transcription with the patient, who gave verbal consent to proceed.  History of Present Illness   The patient, a Proofreader for the past two months, presents with two main concerns. The first issue is irregular menstrual bleeding since getting a birth control implant in the arm. The patient reports having a menstrual period almost all month, every month since the implant was inserted at the end of June. There have been only about three to five days a month when the patient is not on their period. The patient is unsure if this is normal and is seeking advice on how to manage this issue.  The second concern is pain in both arms, which has been a problem for a few days. The patient describes the pain as sharp, similar to a pinched nerve, and it occurs sporadically. The pain in the right arm extends from the shoulder to the elbow, and in the left arm, it is localized between the bones. The pain usually lasts for a few minutes and then subsides. The patient has experienced this pain about three to four times in the past week or week and a half. The onset of the pain was around the eighth or ninth of the current month. The patient is unsure of what triggers the pain but notes that it often occurs during work.           06/15/2023    3:25 PM 02/17/2023    1:39 PM 02/03/2023    1:28 PM  Depression screen PHQ 2/9  Decreased Interest 1 1 1   Down, Depressed, Hopeless 1 0 0   PHQ - 2 Score 2 1 1   Altered sleeping 2 1 0  Tired, decreased energy 2 2 2   Change in appetite 1 1 1   Feeling bad or failure about yourself  0 0 0  Trouble concentrating 0 0 0  Moving slowly or fidgety/restless 0 0 0  Suicidal thoughts 0 0 0  PHQ-9 Score 7 5 4   Difficult doing work/chores  Somewhat difficult Not difficult at all      06/15/2023    3:25 PM 09/24/2022    9:26 AM  GAD 7 : Generalized Anxiety Score  Nervous, Anxious, on Edge 1 1  Control/stop worrying 0 1  Worry too much - different things 0 2  Trouble relaxing 1 1  Restless 0 3  Easily annoyed or irritable 3 2  Afraid - awful might happen 0 0  Total GAD 7 Score 5 10  Anxiety Difficulty  Very difficult    Medications: No outpatient medications prior to visit.   No facility-administered medications prior to visit.    Review of Systems  All other systems reviewed and are negative.  Except see HPI       Objective    BP 111/82 (BP Location: Left Arm, Patient Position: Sitting, Cuff Size: Normal)   Pulse 82   Temp  98.6 F (37 C) (Oral)   Ht 5\' 3"  (1.6 m)   Wt 107 lb 8 oz (48.8 kg)   SpO2 100%   BMI 19.04 kg/m     Physical Exam Constitutional:      General: She is not in acute distress.    Appearance: Normal appearance.  HENT:     Head: Normocephalic.  Pulmonary:     Effort: Pulmonary effort is normal. No respiratory distress.  Musculoskeletal:        General: No swelling, tenderness or deformity.  Neurological:     General: No focal deficit present.     Mental Status: She is alert and oriented to person, place, and time. Mental status is at baseline.     Sensory: No sensory deficit.     Motor: No weakness.     Coordination: Coordination normal.     Deep Tendon Reflexes: Reflexes normal.      No results found for any visits on 06/15/23.  Assessment & Plan       Menstrual bleeding problem Nexplanon Contraceptive Implant Prolonged menstrual bleeding since implantation in June.  Discussed the need for follow-up with the provider who placed the implant. -Contact provider who placed Nexplanon to discuss prolonged menstrual bleeding and potential need for removal or replacement.  Bilateral Arm Pain Sharp pain in both arms, possibly related to repetitive movements at work. No clear trigger identified. Pain rated as 7/10 and lasts for a few minutes. -Advise to monitor movements that may trigger pain. -Recommend stretching exercises before work. -Consider use of a compression sleeve during work. -Apply Voltaren gel or Biofreeze for pain relief.  General Health Maintenance / Followup Plans -Schedule annual physical exam. -Encourage patient to pursue educational goals and explore financial aid options.     Return for CPE within 2 months.     The patient was advised to call back or seek an in-person evaluation if the symptoms worsen or if the condition fails to improve as anticipated.  I discussed the assessment and treatment plan with the patient. The patient was provided an opportunity to ask questions and all were answered. The patient agreed with the plan and demonstrated an understanding of the instructions.  I, Debera Lat, PA-C have reviewed all documentation for this visit. The documentation on  06/15/23 for the exam, diagnosis, procedures, and orders are all accurate and complete.  Debera Lat, [facility], MMS [facility] [phone] (phone) [phone] (fax)  [facility]

## 2023-07-05 ENCOUNTER — Ambulatory Visit: Payer: Medicaid Other | Admitting: Obstetrics & Gynecology

## 2023-07-05 ENCOUNTER — Encounter: Payer: Self-pay | Admitting: Obstetrics & Gynecology

## 2023-07-05 VITALS — BP 108/74 | HR 102 | Wt 103.0 lb

## 2023-07-05 DIAGNOSIS — Z3046 Encounter for surveillance of implantable subdermal contraceptive: Secondary | ICD-10-CM

## 2023-07-05 DIAGNOSIS — N921 Excessive and frequent menstruation with irregular cycle: Secondary | ICD-10-CM

## 2023-07-05 NOTE — Progress Notes (Signed)
   GYNECOLOGY OFFICE VISIT NOTE  History:   Dawn Mosley is a 22 y.o. G0 here today for follow up of irregular bleeding after Nexplanon inserted 03/21/23. Bleeding was every day at some point, varied in flow and color but has stopped. Also some itching at site. She denies any abnormal vaginal discharge, bleeding, pelvic pain or other concerns.    Past Medical History:  Diagnosis Date   Allergy    Anxiety    Depression     Past Surgical History:  Procedure Laterality Date   NO PAST SURGERIES      The following portions of the patient's history were reviewed and updated as appropriate: allergies, current medications, past family history, past medical history, past social history, past surgical history and problem list.   Review of Systems:  Pertinent items noted in HPI and remainder of comprehensive ROS otherwise negative.  Physical Exam:  BP 108/74   Pulse (!) 102   Wt 103 lb (46.7 kg)   LMP 04/05/2023 (Approximate)   BMI 18.25 kg/m  CONSTITUTIONAL: Well-developed, well-nourished female in no acute distress.  SKIN: No rash noted around Nexplanon. No lesions MUSCULOSKELETAL: Normal range of motion. No edema noted. NEUROLOGIC: Alert and oriented to person, place, and time. Normal muscle tone coordination. No cranial nerve deficit noted. PSYCHIATRIC: Normal mood and affect. Normal behavior. Normal judgment and thought content. CARDIOVASCULAR: Normal heart rate noted RESPIRATORY: Effort and breath sounds normal, no problems with respiration noted ABDOMEN: No masses noted. No other overt distention noted.   PELVIC: Deferred    Assessment and Plan:    1. Breakthrough bleeding on Nexplanon Reassured about expected bleeding after Nexplanon. If bleeding recurs, can treat with OCPs or consider removal if patient desires this.  Recommended OTC Benadryl cream for itching as needed, no lesions seen.  Routine preventative health maintenance measures emphasized. Please refer to After  Visit Summary for other counseling recommendations.   Return for follow up as recommended.    I spent 20 minutes dedicated to the care of this patient including pre-visit review of records, face to face time with the patient discussing her conditions and treatments and post visit orders.    Jaynie Collins, MD, FACOG Obstetrician & Gynecologist, [facility] for [employer], [facility]

## 2023-08-09 ENCOUNTER — Ambulatory Visit: Payer: Medicaid Other | Admitting: Physician Assistant

## 2023-08-09 ENCOUNTER — Encounter: Payer: Self-pay | Admitting: Physician Assistant

## 2023-08-09 VITALS — BP 107/73 | HR 112 | Ht 63.0 in | Wt 105.6 lb

## 2023-08-09 DIAGNOSIS — Z0120 Encounter for dental examination and cleaning without abnormal findings: Secondary | ICD-10-CM

## 2023-08-09 DIAGNOSIS — Z0000 Encounter for general adult medical examination without abnormal findings: Secondary | ICD-10-CM

## 2023-08-09 DIAGNOSIS — Z0001 Encounter for general adult medical examination with abnormal findings: Secondary | ICD-10-CM | POA: Diagnosis not present

## 2023-08-09 DIAGNOSIS — R5383 Other fatigue: Secondary | ICD-10-CM | POA: Diagnosis not present

## 2023-08-09 DIAGNOSIS — E049 Nontoxic goiter, unspecified: Secondary | ICD-10-CM

## 2023-08-09 NOTE — Progress Notes (Signed)
Complete physical exam  Patient: Dawn Mosley   DOB: Nov 19, 2000   22 y.o. Female  MRN: [id number] Visit Date: 08/09/2023  Today's healthcare provider: Debera Lat, PA-C   Chief Complaint  Patient presents with   Annual Exam    Patient reports consuming a general diet. No exercise to report. She reports feeling well and sleeping well. No concerns to report.    Subjective    Dawn Mosley is a 22 y.o. female who presents today for a complete physical exam.   HPI     Annual Exam    Additional comments: Patient reports consuming a general diet. No exercise to report. She reports feeling well and sleeping well. No concerns to report.       Last edited by Acey Lav, CMA on 08/09/2023  1:30 PM.      Discussed the use of AI scribe software for clinical note transcription with the patient, who gave verbal consent to proceed.  History of Present Illness   The patient presents for a general physical. They report no specific diet and do not engage in regular exercise due to financial constraints and lack of space. They do walk, but the frequency depends on the weather. They have been vaping nicotine daily for the past five years. They report feeling a bit unmotivated lately, but do not feel they need medication for depression. They are currently working as an Art therapist. They have not seen a dentist in one to two years and have not had an eye exam since August of this year. They report no pain or sensitivity in their teeth. They have a slight cold intolerance and have been feeling a bit tired. They deny any chest pain, shortness of breath, or rapid heart beating. They report no problems with bowel movements, urination, or vaginal discharge. Their menstrual periods are approximately regular.        Last depression screening scores    08/09/2023    1:35 PM 06/15/2023    3:25 PM 02/17/2023    1:39 PM  PHQ 2/9 Scores  PHQ - 2 Score 3 2 1   PHQ- 9 Score 8 7 5    Last  fall risk screening    02/17/2023    1:39 PM  Fall Risk   Falls in the past year? 0  Number falls in past yr: 0  Injury with Fall? 0  Risk for fall due to : No Fall Risks  Follow up Falls evaluation completed   Last Audit-C alcohol use screening    02/17/2023    1:39 PM  Alcohol Use Disorder Test (AUDIT)  1. How often do you have a drink containing alcohol? 0  2. How many drinks containing alcohol do you have on a typical day when you are drinking? 0  3. How often do you have six or more drinks on one occasion? 0  AUDIT-C Score 0   A score of 3 or more in women, and 4 or more in men indicates increased risk for alcohol abuse, EXCEPT if all of the points are from question 1   Past Medical History:  Diagnosis Date   Allergy    Anxiety    Depression    Past Surgical History:  Procedure Laterality Date   NO PAST SURGERIES     Social History   Socioeconomic History   Marital status: Single    Spouse name: Not on file   Number of children: Not on file   Years of  education: Not on file   Highest education level: Not on file  Occupational History   Not on file  Tobacco Use   Smoking status: Never   Smokeless tobacco: Never  Vaping Use   Vaping status: Every Day   Start date: 08/30/2017  Substance and Sexual Activity   Alcohol use: Never   Drug use: Never   Sexual activity: Not Currently    Birth control/protection: None  Other Topics Concern   Not on file  Social History Narrative   Not on file   Social Determinants of Health   Financial Resource Strain: Not on file  Food Insecurity: Not on file  Transportation Needs: Not on file  Physical Activity: Not on file  Stress: Not on file  Social Connections: Not on file  Intimate Partner Violence: Not on file   Family Status  Relation Name Status   Mother  Alive   Father  Alive   Sister  Alive   Brother  Alive   MGM  Deceased  No partnership data on file   Family History  Problem Relation Age of Onset    Cancer Mother        removed 2-3 years ago (lump behind ear)   Diabetes Father 31 - 59       2-3 years ago   Anemia Sister    Lung cancer Maternal Grandmother 37 - 69   Allergies  Allergen Reactions   Wound Dressing Adhesive Itching    Causes rash    Patient Care Team: Cherlynn Polo as PCP - General (Physician Assistant)   Medications: Outpatient Medications Prior to Visit  Medication Sig   Etonogestrel (NEXPLANON [place]) Inject into the skin.   No facility-administered medications prior to visit.    Review of Systems  All other systems reviewed and are negative.  Except see HPI     Objective    BP 107/73 (BP Location: Right Arm, Patient Position: Sitting, Cuff Size: Normal)   Pulse (!) 112   Ht 5\' 3"  (1.6 m)   Wt 105 lb 9.6 oz (47.9 kg)   SpO2 96%   BMI 18.71 kg/m      Physical Exam Vitals reviewed.  Constitutional:      General: She is not in acute distress.    Appearance: Normal appearance. She is well-developed. She is not ill-appearing, toxic-appearing or diaphoretic.  HENT:     Head: Normocephalic and atraumatic.     Right Ear: Tympanic membrane, ear canal and external ear normal.     Left Ear: Tympanic membrane, ear canal and external ear normal.     Nose: Nose normal. No congestion or rhinorrhea.     Mouth/Throat:     Mouth: Mucous membranes are moist.     Pharynx: Oropharynx is clear. No oropharyngeal exudate.  Eyes:     General: No scleral icterus.       Right eye: No discharge.        Left eye: No discharge.     Conjunctiva/sclera: Conjunctivae normal.     Pupils: Pupils are equal, round, and reactive to light.  Neck:     Thyroid: No thyromegaly.     Vascular: No carotid bruit.  Cardiovascular:     Rate and Rhythm: Normal rate and regular rhythm.     Pulses: Normal pulses.     Heart sounds: Normal heart sounds. No murmur heard.    No friction rub. No gallop.  Pulmonary:     Effort: Pulmonary effort is  normal. No respiratory  distress.     Breath sounds: Normal breath sounds. No wheezing or rales.  Abdominal:     General: Abdomen is flat. Bowel sounds are normal. There is no distension.     Palpations: Abdomen is soft. There is no mass.     Tenderness: There is no abdominal tenderness. There is no right CVA tenderness, left CVA tenderness, guarding or rebound.     Hernia: No hernia is present.  Musculoskeletal:        General: No swelling, tenderness, deformity or signs of injury. Normal range of motion.     Cervical back: Normal range of motion and neck supple. No rigidity or tenderness.     Right lower leg: No edema.     Left lower leg: No edema.  Lymphadenopathy:     Cervical: No cervical adenopathy.  Skin:    General: Skin is warm and dry.     Coloration: Skin is not jaundiced or pale.     Findings: No bruising, erythema, lesion or rash.  Neurological:     Mental Status: She is alert and oriented to person, place, and time. Mental status is at baseline.     Gait: Gait normal.  Psychiatric:        Mood and Affect: Mood normal.        Behavior: Behavior normal.        Thought Content: Thought content normal.        Judgment: Judgment normal.      No results found for any visits on 08/09/23.  Assessment & Plan    Routine Health Maintenance and Physical Exam  Exercise Activities and Dietary recommendations  Goals   None     Immunization History  Administered Date(s) Administered   DTaP 04/25/2003, 07/03/2003, 09/05/2003, 05/28/2004, 05/03/2007   HIB (PRP-OMP) 04/25/2003, 07/03/2003, 09/05/2003, 05/28/2004   HPV 9-valent 06/02/2017, 06/04/2020   Hepatitis B, PED/ADOLESCENT 03-11-2003, 03/26/2003, 01/28/2004   Influenza, Seasonal, Injecte, Preservative Fre 06/15/2023   Influenza,inj,Quad PF,6+ Mos 09/24/2022   Influenza-Unspecified 09/05/2003, 08/20/2004, 06/26/2008, 05/29/2009, 05/29/2010, 05/20/2014, 06/02/2017   MMR 02/27/2004, 05/03/2007   MenQuadfi_Meningococcal Groups ACYW Conjugate  06/04/2020   Meningococcal B, Unspecified 05/20/2014   Meningococcal Mcv4o 06/04/2020   PFIZER(Purple Top)SARS-COV-2 Vaccination 04/19/2020   Pfizer(Comirnaty)Fall Seasonal Vaccine 12 years and older 09/24/2022   Pneumococcal Conjugate,unspecified 09/05/2003, 05/28/2004   Pneumococcal-Unspecified 04/25/2003, 07/03/2003   Polio, Unspecified 05/03/2003, 07/03/2003, 01/28/2004, 05/03/2007   Tdap 05/20/2014   Varicella 02/27/2004, 05/03/2007    Health Maintenance  Topic Date Due   DTaP/Tdap/Td vaccine (7 - Td or Tdap) 05/20/2024   Flu Shot  Completed   HPV Vaccine  Completed   COVID-19 Vaccine  Completed   Hepatitis C Screening  Completed   HIV Screening  Completed    Discussed health benefits of physical activity, and encouraged her to engage in regular exercise appropriate for her age and condition. Annual physical exam UTD on eye exam/last exam in August, has rx glasses Needs to see dentist Things to do to keep yourself healthy  - Exercise at least 30-45 minutes a day, 3-4 days a week.  - Eat a low-fat diet with lots of fruits and vegetables, up to 7-9 servings per day.  - Seatbelts can save your life. Wear them always.  - Smoke detectors on every level of your home, check batteries every year.  - Eye Doctor - have an eye exam every 1-2 years  - Safe sex - if you may be exposed to STDs,  use a condom.  - Alcohol -  If you drink, do it moderately, less than 2 drinks per day.  - Health Care Power of Attorney. Choose someone to speak for you if you are not able.  - Depression is common in our stressful world.If you're feeling down or losing interest in things you normally enjoy, please come in for a visit.  - Violence - If anyone is threatening or hurting you, please call immediately.     Physical Inactivity Limited exercise due to financial and space constraints. Discussed the benefits of walking and utilizing free resources like YouTube for at-home exercises. -Encouraged to  increase physical activity through walking and at-home exercises.  Ear Pain Mild discomfort noted during examination, possibly due to dryness. -Recommended use of vegetable oil for lubrication and Debrox for earwax removal. Cautioned against deep insertion of Q-tips.  Seasonal Allergies Post nasal drainage noted during examination. -Recommended use of over-the-counter antihistamines, nasal saline spray, and Flonase.  Nicotine Use Daily vaping for the past five years. -Strongly advised to quit vaping due to associated health risks.  Mild Depression Self-reported lack of motivation and mild elevation in depression screening. -Recommended monitoring and reassessment of need for counseling or medication.  Dental Health Last dental visit approximately 1-2 years ago. -Referral to dentist for routine dental exam.  General Health Maintenance / Followup Plans -Schedule follow-up physical in one year. -Continue regular eye exams (last exam in August 2024).       No follow-ups on file.    The patient was advised to call back or seek an in-person evaluation if the symptoms worsen or if the condition fails to improve as anticipated.  I discussed the assessment and treatment plan with the patient. The patient was provided an opportunity to ask questions and all were answered. The patient agreed with the plan and demonstrated an understanding of the instructions.  I, Debera Lat, PA-C have reviewed all documentation for this visit. The documentation on  08/09/23 for the exam, diagnosis, procedures, and orders are all accurate and complete.  Debera Lat, [facility], MMS [facility] [phone] (phone) [phone] (fax)  [facility]

## 2023-08-09 NOTE — Patient Instructions (Signed)

## 2024-03-28 ENCOUNTER — Other Ambulatory Visit (HOSPITAL_COMMUNITY)
Admission: RE | Admit: 2024-03-28 | Discharge: 2024-03-28 | Disposition: A | Discharge status: Home / Self Care | Source: Ambulatory Visit | Attending: Family Medicine | Admitting: Family Medicine

## 2024-03-28 ENCOUNTER — Encounter: Payer: Self-pay | Admitting: Family Medicine

## 2024-03-28 ENCOUNTER — Ambulatory Visit: Payer: Self-pay | Admitting: Family Medicine

## 2024-03-28 ENCOUNTER — Ambulatory Visit (INDEPENDENT_AMBULATORY_CARE_PROVIDER_SITE_OTHER): Admitting: Family Medicine

## 2024-03-28 VITALS — BP 105/75 | HR 91 | Resp 16 | Ht 63.0 in | Wt 108.0 lb

## 2024-03-28 DIAGNOSIS — N898 Other specified noninflammatory disorders of vagina: Secondary | ICD-10-CM | POA: Insufficient documentation

## 2024-03-28 DIAGNOSIS — R590 Localized enlarged lymph nodes: Secondary | ICD-10-CM

## 2024-03-28 DIAGNOSIS — R300 Dysuria: Secondary | ICD-10-CM | POA: Diagnosis not present

## 2024-03-28 LAB — POCT URINALYSIS DIPSTICK
Bilirubin, UA: NEGATIVE
Blood, UA: NEGATIVE
Glucose, UA: NEGATIVE
Ketones, UA: NEGATIVE
Leukocytes, UA: NEGATIVE
Nitrite, UA: NEGATIVE
Protein, UA: NEGATIVE
Spec Grav, UA: 1.02 (ref 1.010–1.025)
Urobilinogen, UA: 0.2 U/dL
pH, UA: 6 (ref 5.0–8.0)

## 2024-03-28 MED ORDER — FLUCONAZOLE 150 MG PO TABS: ORAL_TABLET | ORAL | 0 refills | Status: DC

## 2024-03-28 NOTE — Progress Notes (Signed)
 Acute Office Visit  Introduced to nurse practitioner role and practice setting.  All questions answered.  Discussed provider/patient relationship and expectations.   Subjective:     Patient ID: Dawn Mosley, female    DOB: 12-May-2001, 23 y.o.   MRN: [id number]  Chief Complaint  Patient presents with   Vaginal Discharge    Brown/beige discharge and itching. For a couple of weeks.    Discussed the use of AI scribe software for clinical note transcription with the patient, who gave verbal consent to proceed.  History of Present Illness Dawn Mosley is a 23 year old female who presents with vaginal itching and discolored discharge.  Vaginal pruritus and abnormal discharge - Severe vaginal itching for the past few weeks - [name], beige vaginal discharge present for the past few weeks - No significant odor associated with the discharge - No fever, chills, or back pain - No changes in weight - Normal bowel movements - History of frequent vulvovaginal candidiasis - Past antifungal treatments have caused nausea  Sexual and contraceptive history - Sexually active - Recent sexual encounter involving genital contact without penetration - Generally uses protection during sexual activity - Uses Nexplanon  for contraception  Lymphadenopathy - Subtly tender, enlarged lymph node on the left side of the neck for approximately one month  Laboratory and diagnostic findings - Recent blood work and urine tests negative for syphilis, HIV, trichomoniasis, herpes simplex, hepatitis A, and hepatitis B - Urine test showed cloudiness and blood, attributed to recent menstrual period, UA poc today negative - No nitrates present in urine  HPI  ROS      Objective:    BP 105/75 (BP Location: Left Arm, Patient Position: Sitting, Cuff Size: Normal)   Pulse 91   Resp 16   Ht 5' 3 (1.6 m)   Wt 108 lb (49 kg)   SpO2 98%   BMI 19.13 kg/m    Physical Exam Constitutional:      General:  She is not in acute distress.    Appearance: Normal appearance. She is normal weight. She is not ill-appearing, toxic-appearing or diaphoretic.  HENT:     Head: Normocephalic.     Right Ear: Tympanic membrane, ear canal and external ear normal.     Left Ear: Tympanic membrane, ear canal and external ear normal.     Nose: Nose normal.     Mouth/Throat:     Mouth: Mucous membranes are moist.     Pharynx: Oropharynx is clear.  Eyes:     Extraocular Movements: Extraocular movements intact.     Conjunctiva/sclera: Conjunctivae normal.     Pupils: Pupils are equal, round, and reactive to light.  Cardiovascular:     Rate and Rhythm: Normal rate and regular rhythm.     Pulses: Normal pulses.     Heart sounds: Normal heart sounds. No murmur heard.    No friction rub. No gallop.  Pulmonary:     Effort: No respiratory distress.     Breath sounds: No stridor. No wheezing, rhonchi or rales.  Chest:     Chest wall: No tenderness.  Abdominal:     General: Abdomen is flat. Bowel sounds are normal. There is no distension.     Palpations: Abdomen is soft. There is no mass.     Tenderness: There is no abdominal tenderness. There is no right CVA tenderness, left CVA tenderness, guarding or rebound.     Hernia: No hernia is present.  Genitourinary:    Comments: Pt  self swabbed Musculoskeletal:     Cervical back: Normal range of motion. No erythema. No pain with movement. Normal range of motion.     Right lower leg: No edema.     Left lower leg: No edema.  Lymphadenopathy:     Cervical: Cervical adenopathy present.     Right cervical: No superficial, deep or posterior cervical adenopathy.    Left cervical: Superficial cervical adenopathy present. No posterior cervical adenopathy.  Skin:    General: Skin is warm and dry.     Capillary Refill: Capillary refill takes less than 2 seconds.     Coloration: Skin is not jaundiced or pale.     Findings: No bruising, erythema or lesion.  Neurological:      General: No focal deficit present.     Mental Status: She is alert and oriented to person, place, and time. Mental status is at baseline.     Cranial Nerves: No cranial nerve deficit.     Motor: No weakness.     Gait: Gait normal.  Psychiatric:        Mood and Affect: Mood normal.        Behavior: Behavior normal.        Thought Content: Thought content normal.        Judgment: Judgment normal.     Results for orders placed or performed in visit on 03/28/24  POCT urinalysis dipstick  Result Value Ref Range   Color, UA Yellow    Clarity, UA Clear    Glucose, UA Negative Negative   Bilirubin, UA Negative    Ketones, UA Negative    Spec Grav, UA 1.020 1.010 - 1.025   Blood, UA Negative    pH, UA 6.0 5.0 - 8.0   Protein, UA Negative Negative   Urobilinogen, UA 0.2 0.2 or 1.0 E.U./dL   Nitrite, UA Negative    Leukocytes, UA Negative Negative   Appearance     Odor          Assessment & Plan:  Assessment and Plan Assessment & Plan Vaginal discharge and pruritus with suspected vaginal candidiasis complicated by potential STI Went to [facility] for STI testiing - blood work was negative RPR, HSV, HIV, Hep B, Hep A Suspected vaginal candidiasis due to symptoms and history.  Discoloration in discharge suggests yeast infection. - POC UA - neg,less likely UTI. No CVA tenderness - Send vaginal swab for culture and cytology. - Prescribe Diflucan  150 mg today and another dose in 72 hours. - Advise taking Diflucan  with food to mitigate stomach discomfort. - Recommend keeping the vaginal/groin area dry, using only water for cleaning, and wearing cotton underwear. - No scented lotions, soaps, detergents - Advise use of Tylenol or ibuprofen for discomfort.  Enlarged left cervical lymph node, subacute Possible reactive lymph node due to infection. Recent CBC normal, no signs of systemic infection. - Order ultrasound of the neck to evaluate the lymph node. - Discuss potential follow-up with  ENT if ultrasound indicates further concern.  Problem List Items Addressed This Visit   None Visit Diagnoses       Dysuria    -  Primary   Relevant Orders   POCT urinalysis dipstick (Completed)     Vaginal discharge       Relevant Orders   Cervicovaginal ancillary only     Vaginal itching       Relevant Medications   fluconazole  (DIFLUCAN ) 150 MG tablet     Enlarged lymph node in neck  Relevant Orders   US  Soft Tissue Head/Neck (NON-THYROID)       Meds ordered this encounter  Medications   fluconazole  (DIFLUCAN ) 150 MG tablet    Sig: Take one tablet today, and second tablet in 72 hours.    Dispense:  2 tablet    Refill:  0    Return if symptoms worsen or fail to improve.  Curtis DELENA Boom, FNP  I, Curtis DELENA Boom, FNP, have reviewed all documentation for this visit. The documentation on 03/28/24 for the exam, diagnosis, procedures, and orders are all accurate and complete.

## 2024-03-30 LAB — CERVICOVAGINAL ANCILLARY ONLY
Bacterial Vaginitis (gardnerella): NEGATIVE
Candida Glabrata: NEGATIVE
Candida Vaginitis: POSITIVE — AB
Chlamydia: NEGATIVE
Comment: NEGATIVE
Comment: NEGATIVE
Comment: NEGATIVE
Comment: NEGATIVE
Comment: NEGATIVE
Comment: NORMAL
Neisseria Gonorrhea: NEGATIVE
Trichomonas: NEGATIVE

## 2024-04-05 ENCOUNTER — Ambulatory Visit

## 2024-04-05 ENCOUNTER — Ambulatory Visit: Admitting: Family Medicine

## 2024-04-09 ENCOUNTER — Ambulatory Visit
Admission: RE | Admit: 2024-04-09 | Discharge: 2024-04-09 | Disposition: A | Discharge status: Home / Self Care | Source: Ambulatory Visit | Attending: Family Medicine | Admitting: Family Medicine

## 2024-04-09 DIAGNOSIS — R590 Localized enlarged lymph nodes: Secondary | ICD-10-CM | POA: Diagnosis not present

## 2024-08-09 ENCOUNTER — Encounter: Payer: Self-pay | Admitting: Physician Assistant

## 2024-09-13 ENCOUNTER — Ambulatory Visit (INDEPENDENT_AMBULATORY_CARE_PROVIDER_SITE_OTHER): Admitting: Physician Assistant

## 2024-09-13 ENCOUNTER — Encounter: Payer: Self-pay | Admitting: Physician Assistant

## 2024-09-13 VITALS — BP 110/74 | HR 96 | Temp 99.0°F | Ht 63.0 in | Wt 110.6 lb

## 2024-09-13 DIAGNOSIS — Z59869 Financial insecurity, unspecified: Secondary | ICD-10-CM | POA: Diagnosis not present

## 2024-09-13 DIAGNOSIS — F419 Anxiety disorder, unspecified: Secondary | ICD-10-CM

## 2024-09-13 DIAGNOSIS — F32A Depression, unspecified: Secondary | ICD-10-CM

## 2024-09-13 DIAGNOSIS — Z5941 Food insecurity: Secondary | ICD-10-CM

## 2024-09-13 DIAGNOSIS — F5089 Other specified eating disorder: Secondary | ICD-10-CM | POA: Diagnosis not present

## 2024-09-13 DIAGNOSIS — Z975 Presence of (intrauterine) contraceptive device: Secondary | ICD-10-CM

## 2024-09-13 DIAGNOSIS — Z23 Encounter for immunization: Secondary | ICD-10-CM

## 2024-09-13 DIAGNOSIS — Z0000 Encounter for general adult medical examination without abnormal findings: Secondary | ICD-10-CM

## 2024-09-13 DIAGNOSIS — Z124 Encounter for screening for malignant neoplasm of cervix: Secondary | ICD-10-CM

## 2024-09-13 MED ORDER — ESCITALOPRAM OXALATE 5 MG PO TABS: 5.0000 mg | ORAL_TABLET | Freq: Every day | ORAL | 1 refills | Status: AC

## 2024-09-13 NOTE — Progress Notes (Signed)
 "    Complete physical exam  Patient: Dawn Mosley   DOB: 05/11/2001   23 y.o. Female  MRN: [id number] Visit Date: 09/13/2024  Today's healthcare provider: Jolynn Spencer, PA-C   Chief Complaint  Patient presents with   Annual Exam    Diet- General Exercise- Not really Overall feeling- depressed, possibly getting on an antidepressant Sleep- Not consistent its all over the place Concerns- Depression  Wants to discuss pap and vaccines   Subjective    Dawn Mosley is a 24 y.o. female who presents today for a complete physical exam.   HPI     Annual Exam    Additional comments: Diet- General Exercise- Not really Overall feeling- depressed, possibly getting on an antidepressant Sleep- Not consistent its all over the place Concerns- Depression  Wants to discuss pap and vaccines      Last edited by Terrel Powell CROME, CMA on 09/13/2024  1:57 PM.       Discussed the use of AI scribe software for clinical note transcription with the patient, who gave verbal consent to proceed.  History of Present Illness Dawn Mosley is a 24 year old female who presents for a flu vaccine and discussion of other vaccinations.  She would like a flu vaccine today and to review other vaccine needs.  She is considering a tetanus shot but prefers to receive it at a later visit to avoid possible side effects from multiple vaccines on the same day.  She has persistent but improving cold intolerance, along with fatigue and tiredness. She finds balancing intermittent work with studying difficult. She feels more depressed than anxious and is interested in starting treatment for anxiety or depression.  She eats irregularly, sometimes only once daily or every other day, due to low appetite and lack of interest in available food. She is trying to improve her nutrition by buying more food and cooking varied meals but finds this difficult because of grocery costs and believes her income is too high to  qualify for food stamps.  She has not been sexually active for a few months. She uses Nexplanon  for contraception, placed by OBGYN in 2024. She has never had a Pap smear and has not seen her OBGYN since Nexplanon  placement.  She last saw an eye doctor in May of last year for a work exam and a dentist about one to two years ago.    Last depression screening scores    08/09/2023    1:35 PM 06/15/2023    3:25 PM 02/17/2023    1:39 PM  PHQ 2/9 Scores  PHQ - 2 Score 3 2 1   PHQ- 9 Score 8  7  5       Data saved with a previous flowsheet row definition   Last fall risk screening    09/13/2024    2:03 PM  Fall Risk   Falls in the past year? 0  Number falls in past yr: 0  Injury with Fall? 0  Risk for fall due to : No Fall Risks   Last Audit-C alcohol use screening    08/07/2024    5:43 PM  Alcohol Use Disorder Test (AUDIT)  1. How often do you have a drink containing alcohol? 2   2. How many drinks containing alcohol do you have on a typical day when you are drinking? 0   3. How often do you have six or more drinks on one occasion? 0   AUDIT-C Score 2  Manually entered by patient   A score of 3 or more in women, and 4 or more in men indicates increased risk for alcohol abuse, EXCEPT if all of the points are from question 1   Past Medical History:  Diagnosis Date   Allergy    Anxiety    Depression    Past Surgical History:  Procedure Laterality Date   NO PAST SURGERIES     Social History   Socioeconomic History   Marital status: Single    Spouse name: Not on file   Number of children: Not on file   Years of education: Not on file   Highest education level: 12th grade  Occupational History   Not on file  Tobacco Use   Smoking status: Never   Smokeless tobacco: Never  Vaping Use   Vaping status: Every Day   Start date: 08/30/2017  Substance and Sexual Activity   Alcohol use: Not Currently    Alcohol/week: 5.0 standard drinks of alcohol    Types: 3 Shots of  liquor, 2 Standard drinks or equivalent per week   Drug use: Never   Sexual activity: Not Currently    Birth control/protection: Condom, Implant  Other Topics Concern   Not on file  Social History Narrative   Not on file   Social Drivers of Health   Tobacco Use: Low Risk (09/13/2024)   Patient History    Smoking Tobacco Use: Never    Smokeless Tobacco Use: Never    Passive Exposure: Not on file  Financial Resource Strain: Low Risk (08/07/2024)   Overall Financial Resource Strain (CARDIA)    Difficulty of Paying Living Expenses: Not very hard  Food Insecurity: No Food Insecurity (08/07/2024)   Epic    Worried About Programme Researcher, Broadcasting/film/video in the Last Year: Never true    Ran Out of Food in the Last Year: Never true  Transportation Needs: No Transportation Needs (08/07/2024)   Epic    Lack of Transportation (Medical): No    Lack of Transportation (Non-Medical): No  Physical Activity: Sufficiently Active (08/07/2024)   Exercise Vital Sign    Days of Exercise per Week: 5 days    Minutes of Exercise per Session: 80 min  Stress: No Stress Concern Present (08/07/2024)   [employer] of Occupational Health - Occupational Stress Questionnaire    Feeling of Stress: Only a little  Social Connections: Socially Isolated (08/07/2024)   Social Connection and Isolation Panel    Frequency of Communication with Friends and Family: Three times a week    Frequency of Social Gatherings with Friends and Family: Patient declined    Attends Religious Services: Never    Database Administrator or Organizations: No    Attends Engineer, Structural: Not on file    Marital Status: Never married  Intimate Partner Violence: Not on file  Depression (PHQ2-9): Medium Risk (08/09/2023)   Depression (PHQ2-9)    PHQ-2 Score: 8  Alcohol Screen: Low Risk (08/07/2024)   Alcohol Screen    Last Alcohol Screening Score (AUDIT): 2  Housing: Low Risk (08/07/2024)   Epic    Unable to Pay for Housing in the Last  Year: No    Number of Times Moved in the Last Year: 0    Homeless in the Last Year: No  Utilities: Not on file  Health Literacy: Not on file   Family Status  Relation Name Status   Mother  Alive   Father  Alive  Sister  Alive   Brother  Alive   MGM  Deceased  No partnership data on file   Family History  Problem Relation Age of Onset   Cancer Mother        removed 2-3 years ago (lump behind ear)   Diabetes Father 54 - 35       2-3 years ago   Anemia Sister    Lung cancer Maternal Grandmother 91 - 69   Allergies[1]  Patient Care Team: Evalyse Stroope, PA-C as PCP - General (Physician Assistant)   Medications: Show/hide medication list[2]  Review of Systems Except see HPI     Objective    BP 110/74 (BP Location: Right Arm, Patient Position: Sitting, Cuff Size: Normal)   Pulse 96   Temp 99 F (37.2 C) (Oral)   Ht 5' 3 (1.6 m)   Wt 110 lb 9.6 oz (50.2 kg)   SpO2 100%   BMI 19.59 kg/m      Physical Exam Vitals reviewed.  Constitutional:      General: She is not in acute distress.    Appearance: Normal appearance. She is well-developed. She is not ill-appearing, toxic-appearing or diaphoretic.  HENT:     Head: Normocephalic and atraumatic.     Right Ear: Tympanic membrane, ear canal and external ear normal.     Left Ear: Tympanic membrane, ear canal and external ear normal.     Nose: Nose normal. No congestion or rhinorrhea.     Mouth/Throat:     Mouth: Mucous membranes are moist.     Pharynx: Oropharynx is clear. No oropharyngeal exudate.  Eyes:     General: No scleral icterus.       Right eye: No discharge.        Left eye: No discharge.     Conjunctiva/sclera: Conjunctivae normal.     Pupils: Pupils are equal, round, and reactive to light.  Neck:     Thyroid : No thyromegaly.     Vascular: No carotid bruit.  Cardiovascular:     Rate and Rhythm: Normal rate and regular rhythm.     Pulses: Normal pulses.     Heart sounds: Normal heart sounds. No  murmur heard.    No friction rub. No gallop.  Pulmonary:     Effort: Pulmonary effort is normal. No respiratory distress.     Breath sounds: Normal breath sounds. No wheezing or rales.  Abdominal:     General: Abdomen is flat. Bowel sounds are normal. There is no distension.     Palpations: Abdomen is soft. There is no mass.     Tenderness: There is no abdominal tenderness. There is no right CVA tenderness, left CVA tenderness, guarding or rebound.     Hernia: No hernia is present.  Musculoskeletal:        General: No swelling, tenderness, deformity or signs of injury. Normal range of motion.     Cervical back: Normal range of motion and neck supple. No rigidity or tenderness.     Right lower leg: No edema.     Left lower leg: No edema.  Lymphadenopathy:     Cervical: No cervical adenopathy.  Skin:    General: Skin is warm and dry.     Coloration: Skin is not jaundiced or pale.     Findings: No bruising, erythema, lesion or rash.  Neurological:     Mental Status: She is alert and oriented to person, place, and time. Mental status is at baseline.  Gait: Gait normal.  Psychiatric:        Mood and Affect: Mood normal.        Behavior: Behavior normal.        Thought Content: Thought content normal.        Judgment: Judgment normal.      No results found for any visits on 09/13/24.  Assessment & Plan    Routine Health Maintenance and Physical Exam  Exercise Activities and Dietary recommendations  Goals   None     Immunization History  Administered Date(s) Administered   DTaP 04/25/2003, 07/03/2003, 09/05/2003, 05/28/2004, 05/03/2007   HIB (PRP-OMP) 04/25/2003, 07/03/2003, 09/05/2003, 05/28/2004   HPV 9-valent 06/02/2017, 06/04/2020   Hepatitis B, PED/ADOLESCENT 09/28/2002, 03/26/2003, 01/28/2004   Influenza, Seasonal, Injecte, Preservative Fre 06/15/2023   Influenza,inj,Quad PF,6+ Mos 09/24/2022   Influenza-Unspecified 09/05/2003, 08/20/2004, 06/26/2008, 05/29/2009,  05/29/2010, 05/20/2014, 06/02/2017   MMR 02/27/2004, 05/03/2007   MenQuadfi_Meningococcal Groups ACYW Conjugate 06/04/2020   Meningococcal B, Unspecified 05/20/2014   Meningococcal Mcv4o 06/04/2020   PFIZER(Purple Top)SARS-COV-2 Vaccination 04/19/2020   Pfizer(Comirnaty)Fall Seasonal Vaccine 12 years and older 09/24/2022   Pneumococcal Conjugate,unspecified 09/05/2003, 05/28/2004   Pneumococcal-Unspecified 04/25/2003, 07/03/2003   Polio, Unspecified 05/03/2003, 07/03/2003, 01/28/2004, 05/03/2007   Tdap 05/20/2014   Varicella 02/27/2004, 05/03/2007    Health Maintenance  Topic Date Due   Meningitis B Vaccine (1 of 2 - Standard) 02/23/2019   Pap Smear  Never done   Flu Shot  03/30/2024   COVID-19 Vaccine (3 - 2025-26 season) 04/30/2024   DTaP/Tdap/Td vaccine (7 - Td or Tdap) 05/20/2024   Hepatitis B Vaccine  Completed   HPV Vaccine  Completed   Hepatitis C Screening  Completed   HIV Screening  Completed   Pneumococcal Vaccine  Aged Out    Discussed health benefits of physical activity, and encouraged her to engage in regular exercise appropriate for her age and condition. Assessment & Plan Annual physical exam Routine wellness visit with vaccination and screening discussion. Pap smear deferred to OBGYN. - Administered flu vaccine today. - Schedule tetanus shot within the next month. - Schedule annual gynecological exam with Pap smear with OBGYN. - Ensure eye and dental exams are up to date within the next year.  Things to do to keep yourself healthy  - Exercise at least 30-45 minutes a day, 3-4 days a week.  - Eat a low-fat diet with lots of fruits and vegetables, up to 7-9 servings per day.  - Seatbelts can save your life. Wear them always.  - Smoke detectors on every level of your home, check batteries every year.  - Eye Doctor - have an eye exam every 1-2 years  - Safe sex - if you may be exposed to STDs, use a condom.  - Alcohol -  If you drink, do it moderately, less  than 2 drinks per day.  - Health Care Power of Attorney. Choose someone to speak for you if you are not able.  - Depression is common in our stressful world.If you're feeling down or losing interest in things you normally enjoy, please come in for a visit.  - Violence - If anyone is threatening or hurting you, please call immediately.  Depression and Anxiety Reports more depression than anxiety. Interested in antidepressant therapy. Discussed benefits and insurance coverage of generic antidepressants. - Started generic antidepressant. - Referred to [facility] for assessment. - Scheduled follow-up in 4-6 weeks to assess response to medication.  Patient and/or legal guardian verbally consented to [facility] services about presenting concerns and psychiatric consultation as appropriate. The services will be billed as appropriate for the patient   Collaboration of Care: Medication Management AEB  , Primary Care Provider AEB  , Psychiatrist AEB  , and Referral or follow-up with counselor/therapist AEB    Patient/Guardian was advised Release of Information must be obtained prior to any record release in order to collaborate their care with an outside provider. Patient/Guardian was advised if they have not already done so to contact the registration department to sign all necessary forms in order for us  to release information regarding their care.   Consent: Patient/Guardian gives verbal consent for treatment and assignment of benefits for services provided during this visit. Patient/Guardian expressed understanding and agreed to proceed.   Disordered Eating and Unintentional Weight Loss Reports irregular eating patterns and financial constraints affecting meal regularity. Discussed importance of regular meals and potential nutritional support. - Encouraged eating at least two to three times daily. - Offered nutritional support if available. - Discussed potential for  food assistance programs.  Contraceptive Management (Nexplanon ) Currently using Nexplanon  for contraception. No recent follow-up with OBGYN since insertion. - Schedule follow-up with OBGYN for pap smear, annual exam and Nexplanon  management.   Need for influenza vaccination  - Flu vaccine trivalent PF, 6mos and older(Flulaval,Afluria,Fluarix,Fluzone)  Anxiety and depression  - scitalopram (LEXAPRO ) 5 MG tablet; Take 1 tablet (5 mg total) by mouth daily.  Dispense: 30 tablet; Refill: 1 - Amb ref to [facility]  Other disorder of eating - Amb ref to [employer] insecurity Food insecurity Provided with a bag from food pantry.  No follow-ups on file.    The patient was advised to call back or seek an in-person evaluation if the symptoms worsen or if the condition fails to improve as anticipated.  I discussed the assessment and treatment plan with the patient. The patient was provided an opportunity to ask questions and all were answered. The patient agreed with the plan and demonstrated an understanding of the instructions.  I, Bruk Tumolo, PA-C have reviewed all documentation for this visit. The documentation on 09/13/2024  for the exam, diagnosis, procedures, and orders are all accurate and complete.  Jolynn Spencer, [facility], MMS [facility] [phone] (phone) [phone] (fax)  [facility]     [1]  Allergies Allergen Reactions   Wound Dressing Adhesive Itching    Causes rash  [2]  Outpatient Medications Prior to Visit  Medication Sig   Etonogestrel  (NEXPLANON  [place]) Inject into the skin.   [DISCONTINUED] fluconazole  (DIFLUCAN ) 150 MG tablet Take one tablet today, and second tablet in 72 hours.   No facility-administered medications prior to visit.   "

## 2024-09-14 DIAGNOSIS — Z5941 Food insecurity: Secondary | ICD-10-CM | POA: Insufficient documentation

## 2024-09-14 DIAGNOSIS — Z0000 Encounter for general adult medical examination without abnormal findings: Secondary | ICD-10-CM | POA: Insufficient documentation

## 2024-09-14 DIAGNOSIS — Z59869 Financial insecurity, unspecified: Secondary | ICD-10-CM | POA: Insufficient documentation

## 2024-09-14 DIAGNOSIS — F509 Eating disorder, unspecified: Secondary | ICD-10-CM | POA: Insufficient documentation

## 2024-09-14 DIAGNOSIS — F419 Anxiety disorder, unspecified: Secondary | ICD-10-CM | POA: Insufficient documentation

## 2024-09-21 ENCOUNTER — Telehealth: Payer: Self-pay | Admitting: Licensed Clinical Social Worker

## 2024-09-21 NOTE — Telephone Encounter (Signed)
 Changed [facility] appt from in person to virtual due to inclement weather

## 2024-09-24 ENCOUNTER — Ambulatory Visit: Payer: Self-pay | Admitting: Licensed Clinical Social Worker

## 2024-09-24 DIAGNOSIS — F331 Major depressive disorder, recurrent, moderate: Secondary | ICD-10-CM

## 2024-09-24 NOTE — Addendum Note (Signed)
 Addended by: Reneisha Stilley R on: 09/24/2024 10:54 AM   Modules accepted: Orders, Level of Service
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
## 2024-09-24 NOTE — Patient Instructions (Signed)
 Using Behavioral Activation to manage stress/depression symptoms     Identify/understand your own mood triggers.   Structure your day--get up around the same time, eat meals/snacks around the same time, go to bed around the same time.   Purposefully schedule self care time and time to complete tasks. This can include quiet time.  Stimulate your brain--go for a walk, text/call a friend or family member, if you are indoors--go outside (and vice versa), go for a drive, go to a store with bright colors and bright lights. Try to do things in a different way--drive to your favorite places using an alternative route, or instead of starting on the right side of the grocery store when shopping, start on the left side. You might feel a bit uncomfortable doing things outside of the comfort zone, but this is helping the brain create new neural pathways and is very healthy for brain/emotional health.   Physical movement based on your ability. If you can go for a walk, do stretches, even waving your hands to music can trigger feel-good endorphins in the brain and help release physical tension we all hold in our bodies.  Even 5 minutes can make a difference.   Be intentional about doing things that bring you joy (or used to bring you joy), and look for the things in every day that make you happy.  Seek those glimmers of joy each day.  Set a timer for 5 minutes for a harder task (ex. Laundry, washing dishes).  Allow yourself to work distraction-free for 5 minutes, then stop when the timer goes off. If you need a break, take a break. If you want to continue working then set another timer for whatever time you choose.   Limit or eliminate substance use including alcohol, marijuana, or recreational use of prescription medication.  Let in the light!! Open the window blinds, curtains and let natural light in. Even sitting near a window or sitting outside can boost your mood, especially in the wintertime when  there is less daylight.   If you take medication to manage symptoms, remember to take all medications as they are prescribed (please read all labels!!)    Things to envision for ourselves to to improve inspiration, motivation, and initiative :  improving physical wellness, focus on family relationships, focusing on our own mental/emotional well being, being a part of a bigger community, finding a hobby, being a part of something that fosters personal growth, engaging socially with others (even digitally!!!)    Emergency Resources:  National Suicide & Crisis Lifeline: Call or text 988  Crisis Text Line: Text HOME to 478-823-2979  [facility]  [address], [place], [place] [address] [phone] or [phone] WALK-IN URGENT CARE 24/7 FOR ANYONE [address], [place], [place]  [phone] Fax: [phone] [email or web address] *Interpreters available *Accepts all insurance and uninsured for Urgent Care needs *Accepts Medicaid and uninsured for outpatient treatment (below)

## 2024-09-25 ENCOUNTER — Encounter: Admitting: Physician Assistant

## 2024-09-26 ENCOUNTER — Telehealth (INDEPENDENT_AMBULATORY_CARE_PROVIDER_SITE_OTHER): Payer: Self-pay | Admitting: Licensed Clinical Social Worker

## 2024-09-26 DIAGNOSIS — F331 Major depressive disorder, recurrent, moderate: Secondary | ICD-10-CM
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
## 2024-10-15 ENCOUNTER — Ambulatory Visit: Admitting: Licensed Clinical Social Worker

## 2024-10-25 ENCOUNTER — Ambulatory Visit: Admitting: Physician Assistant
